# Patient Record
Sex: Female | Born: 2000 | Race: White | Hispanic: No | Marital: Single | Smoking: Current every day smoker
Health system: Southern US, Community
[De-identification: ages and names within clinical notes are randomized; demographics above are authoritative.]

## PROBLEM LIST (undated history)

## (undated) DIAGNOSIS — F32A Depression, unspecified: Secondary | ICD-10-CM

## (undated) DIAGNOSIS — F329 Major depressive disorder, single episode, unspecified: Secondary | ICD-10-CM

## (undated) DIAGNOSIS — F419 Anxiety disorder, unspecified: Secondary | ICD-10-CM

## (undated) HISTORY — DX: Anxiety disorder, unspecified: F41.9

## (undated) HISTORY — DX: Major depressive disorder, single episode, unspecified: F32.9

## (undated) HISTORY — DX: Depression, unspecified: F32.A

---
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
## 2019-02-04 ENCOUNTER — Other Ambulatory Visit: Payer: Self-pay

## 2019-02-04 DIAGNOSIS — Z20822 Contact with and (suspected) exposure to covid-19: Secondary | ICD-10-CM

## 2019-02-07 LAB — NOVEL CORONAVIRUS, NAA: SARS-CoV-2, NAA: NOT DETECTED

## 2019-04-20 ENCOUNTER — Ambulatory Visit: Payer: 59 | Admitting: Family Medicine

## 2019-07-03 ENCOUNTER — Ambulatory Visit: Payer: 59 | Admitting: Family Medicine

## 2019-07-03 ENCOUNTER — Other Ambulatory Visit: Payer: Self-pay

## 2019-07-03 ENCOUNTER — Encounter: Payer: Self-pay | Admitting: Family Medicine

## 2019-07-03 ENCOUNTER — Ambulatory Visit (INDEPENDENT_AMBULATORY_CARE_PROVIDER_SITE_OTHER)
Admission: RE | Admit: 2019-07-03 | Discharge: 2019-07-03 | Disposition: A | Discharge status: Home / Self Care | Payer: 59 | Source: Ambulatory Visit | Attending: Family Medicine | Admitting: Family Medicine

## 2019-07-03 VITALS — BP 100/64 | HR 95 | Temp 99.1°F | Ht 65.0 in | Wt 148.0 lb

## 2019-07-03 DIAGNOSIS — F32A Depression, unspecified: Secondary | ICD-10-CM

## 2019-07-03 DIAGNOSIS — F419 Anxiety disorder, unspecified: Secondary | ICD-10-CM | POA: Diagnosis not present

## 2019-07-03 DIAGNOSIS — M25531 Pain in right wrist: Secondary | ICD-10-CM | POA: Diagnosis not present

## 2019-07-03 DIAGNOSIS — G8929 Other chronic pain: Secondary | ICD-10-CM | POA: Diagnosis not present

## 2019-07-03 DIAGNOSIS — F329 Major depressive disorder, single episode, unspecified: Secondary | ICD-10-CM | POA: Diagnosis not present

## 2019-07-03 MED ORDER — SERTRALINE HCL 50 MG PO TABS: 50.0000 mg | ORAL_TABLET | Freq: Every day | ORAL | 1 refills | Status: DC

## 2019-07-03 NOTE — Progress Notes (Signed)
Subjective:    Patient ID: Megan Eaton, female    DOB: 11-11-2000, 19 y.o.   MRN: [id number]  HPI Chief Complaint  Patient presents with  . Pain    c/o pain right wrist pain and sometimes pain shooting pains in right hand that come and go symptoms x 2 years pain have come back x 1 month, Ibuprofen not helping.    This is a 19 yo female who presents today with above cc.   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 Was seen 5/20 for a virtual visit for anxiety and depression.  Did not start escitalopram.  Did not follow-up as advised continues to have daily anxiety. Sleeping 6-7 hours a night which is improved.  Feels anxious daily.  This has been going on for many years.  In the past she was able to take hydroxyzine but more recently it makes her too sedated.  She lives with her mother.  She has been treated with multiple medications in the past including lithium and Seroquel.  She does not feel that she is currently having any symptoms of mania.  She is interested in resuming a daily medication.  She denies suicidal ideation  Right wrist pain- no known injury. Pain with flexion of hand. Worse with work, pain at back of hand/ wrist. Has a brace which helps some. Worse with overuse. Worse with ice. Has tried heat. Occasional swelling. Has tired ibuprofen 400-600 mg with little relief.    Review of Systems Per HPI    Objective:   Physical Exam Vitals reviewed.  Constitutional:      General: She is not in acute distress.    Appearance: Normal appearance. She is normal weight. She is not ill-appearing, toxic-appearing or diaphoretic.  HENT:     Head: Normocephalic and atraumatic.  Eyes:     Conjunctiva/sclera: Conjunctivae normal.  Cardiovascular:     Rate and Rhythm: Normal rate.  Pulmonary:     Effort: Pulmonary effort is normal.  Musculoskeletal:     Right wrist: Tenderness (lateral) present. No swelling, deformity, effusion or crepitus.  Decreased range of motion (flexion). Normal pulse.  Skin:    General: Skin is warm and dry.  Neurological:     Mental Status: She is alert and oriented to person, place, and time.  Psychiatric:        Mood and Affect: Mood normal.        Behavior: Behavior normal.        Thought Content: Thought content normal.        Judgment: Judgment normal.       BP 100/64   Pulse 95   Temp 99.1 F (37.3 C) (Tympanic)   Ht 5\' 5"  (1.651 m)   Wt 148 lb (67.1 kg)   SpO2 95%   BMI 24.63 kg/m  Wt Readings from Last 3 Encounters:  07/03/19 148 lb (67.1 kg) (78 %, Z= 0.79)*  08/20/18 150 lb (68 kg) (82 %, Z= 0.93)*  01/31/18 141 lb 12.8 oz (64.3 kg) (76 %, Z= 0.72)*   * Growth percentiles are based on CDC (Girls, 2-20 Years) data.       Depression screen [facility] 2/9 07/03/2019 01/31/2018  Decreased Interest 0 2  Down, Depressed, Hopeless 1 1  PHQ - 2 Score 1 3  Altered sleeping - 3  Tired, decreased energy - 3  Change in appetite - 3  Feeling bad or failure about yourself  - 0  Trouble  concentrating - 0  Moving slowly or fidgety/restless - 1  Suicidal thoughts - 0  PHQ-9 Score - 13   . Assessment & Plan:  1. Chronic pain of right wrist -We will check x-ray today.  Discussed additional recommendations.  If x-ray normal, can continue bracing, over-the-counter analgesics, ice/heat.  Follow-up with sports medicine for persistent pain.  If abnormal x-ray, will refer her to orthopedics. - DG Wrist Complete Right; Future  2. Anxiety and depression -Discussed medication and potential side effects including increased depression and suicidal ideation. -She was instructed to stop medication and follow-up immediately if any worsening of symptoms -Provided verbal and written information regarding expectations of treatment and nonpharmacologic interventions for anxiety and depression.  Encouraged her to reconsider therapy. -Follow-up in 8 weeks, can be virtual visit for symptoms - sertraline (ZOLOFT)  50 MG tablet; Take 1 tablet (50 mg total) by mouth daily.  Dispense: 90 tablet; Refill: 1  This visit occurred during the SARS-CoV-2 public health emergency.  Safety protocols were in place, including screening questions prior to the visit, additional usage of staff PPE, and extensive cleaning of exam room while observing appropriate contact time as indicated for disinfecting solutions.    [name], FNP-BC  [facility] at [facility], [facility]  07/05/2019 8:38 AM

## 2019-07-03 NOTE — Patient Instructions (Signed)
I have sent in sertraline (Zoloft), take 1/2 tablet every evening at bedtime for 5 days, then increase to 1 tablet  Follow up- can be virtual, in 8 weeks  Medication for depression and anxiety often takes 6-8 weeks to have a noticeable difference so stick with it. Also the best way for recovery is taking medication and seeing a therapist -- this is so important.      How to help anxiety and depression   1) Regular Exercise - walking, jogging, cycling, dancing, strength training - aiming for 150 minutes of exercise a week -- Yoga has been shown in research to reduce depression and anxiety -- with even just one hour long session per week -- Walk leisurely for 30 minutes every day  2)  Begin a Mindfulness/Meditation practice -- this can take a little as 3 minutes and is helpful for all kinds of mood issues -- You can find resources in books -- Or you can download apps like  -- Headspace App  -- Calm  -- Insignt Timer -- Stop, Breathe & Think   # With each of these Apps - you should decline the "start free trial" offer and as you search through the App should be able to access some of their free content. You can also chose to pay for the content if you find one that works well for you.    # Many of them also offer sleep specific content which may help with insomnia   3) Healthy Diet - Avoid fast foods and processed foods, eat mostly lean proteins, vegetables, fruits and whole grains -- Avoid or decrease Caffeine -- Avoid or decrease Alcohol -- Drink plenty of water, have a balanced diet -- Avoid cigarettes and marijuana (as well as other recreational drugs)   4) Consider contacting a professional therapist  [facility] [phone]

## 2019-07-06 ENCOUNTER — Encounter: Payer: Self-pay | Admitting: Family Medicine

## 2019-07-31 ENCOUNTER — Other Ambulatory Visit: Payer: Self-pay | Admitting: Family Medicine

## 2019-07-31 DIAGNOSIS — F32A Depression, unspecified: Secondary | ICD-10-CM

## 2019-07-31 DIAGNOSIS — F419 Anxiety disorder, unspecified: Secondary | ICD-10-CM

## 2019-07-31 NOTE — Telephone Encounter (Signed)
Called patient on clarity of medication to see if she needs a refill, no answer unable to leave a message mailbox full.

## 2019-07-31 NOTE — Telephone Encounter (Signed)
Patient returned call  Advised of message and patient stated she did need refill on medication She stated she is almost out.

## 2019-08-28 ENCOUNTER — Telehealth: Payer: 59 | Admitting: Family Medicine

## 2019-09-07 ENCOUNTER — Telehealth (INDEPENDENT_AMBULATORY_CARE_PROVIDER_SITE_OTHER): Payer: 59 | Admitting: Family Medicine

## 2019-09-07 ENCOUNTER — Encounter: Payer: Self-pay | Admitting: Family Medicine

## 2019-09-07 ENCOUNTER — Telehealth: Payer: Self-pay | Admitting: Obstetrics and Gynecology

## 2019-09-07 VITALS — Ht 65.0 in | Wt 145.0 lb

## 2019-09-07 DIAGNOSIS — M25531 Pain in right wrist: Secondary | ICD-10-CM

## 2019-09-07 DIAGNOSIS — F329 Major depressive disorder, single episode, unspecified: Secondary | ICD-10-CM

## 2019-09-07 DIAGNOSIS — J3081 Allergic rhinitis due to animal (cat) (dog) hair and dander: Secondary | ICD-10-CM

## 2019-09-07 DIAGNOSIS — F32A Depression, unspecified: Secondary | ICD-10-CM

## 2019-09-07 DIAGNOSIS — F419 Anxiety disorder, unspecified: Secondary | ICD-10-CM | POA: Diagnosis not present

## 2019-09-07 DIAGNOSIS — G8929 Other chronic pain: Secondary | ICD-10-CM

## 2019-09-07 MED ORDER — SERTRALINE HCL 50 MG PO TABS: 75.0000 mg | ORAL_TABLET | Freq: Every day | ORAL | 1 refills | Status: DC

## 2019-09-07 MED ORDER — FLUTICASONE PROPIONATE 50 MCG/ACT NA SUSP: 2.0000 | Freq: Every day | NASAL | 6 refills | Status: AC

## 2019-09-07 NOTE — Progress Notes (Signed)
Virtual Visit via Video Note  I connected with Megan Eaton on 09/07/19 at 11:30 AM EDT by a video enabled telemedicine application and verified that I am speaking with the correct person using two identifiers.  Location: Patient: In her home Provider: [facility] Persons participating in virtual visit: Patient and provider   I discussed the limitations of evaluation and management by telemedicine and the availability of in person appointments. The patient expressed understanding and agreed to proceed.  History of Present Illness: This is a 19 year old female who presents today for virtual visit to follow-up on depression/anxiety and right wrist pain.  She last had an office visit 07/03/2019-at that time we reviewed findings from PHQ-9 and discussed medications that she had previously been on.  She was agreeable to starting sertraline and looking into therapy.  She has been taking sertraline 50 mg and has done well.  She is currently having weekly therapy sessions which she also feels are helpful.  Current GAD-7 score 2, PHQ-9 score 10.  GAD score significantly improved, PHQ-9 score increased.  She reports that her anxiety is significantly improved.  She is taking occasional hydroxyzine for breakthrough panic attacks.  She is having good relief with this.  She reports that she feels her depression is just "the way it is going to be."  Energy level has been good.  She has been cleaning her room and doing some painting.  She had a family vacation in [place] which was enjoyable.  She continues to work at [employer].  Allergy symptoms improved with hydroxyzine as needed.  Her mother is going to be getting a cat soon and she is concerned about allergies as she has known cat allergy.  Right wrist pain.  Was better now is worse.  She is interested in seeing orthopedics but she wants to check with her insurance to see who is covered prior to me placing referral.    Observations/Objective:  Ht 5\' 5"  (1.651 m)   Wt 145 lb (65.8 kg)   BMI 24.13 kg/m  Wt Readings from Last 3 Encounters:  09/07/19 145 lb (65.8 kg) (75 %, Z= 0.67)*  07/03/19 148 lb (67.1 kg) (78 %, Z= 0.79)*  08/20/18 150 lb (68 kg) (82 %, Z= 0.93)*   * Growth percentiles are based on CDC (Girls, 2-20 Years) data.   Depression screen [facility] 2/9 09/07/2019 07/03/2019 01/31/2018  Decreased Interest 1 0 2  Down, Depressed, Hopeless 1 1 1   PHQ - 2 Score 2 1 3   Altered sleeping 3 - 3  Tired, decreased energy 1 - 3  Change in appetite 3 - 3  Feeling bad or failure about yourself  0 - 0  Trouble concentrating 1 - 0  Moving slowly or fidgety/restless 0 - 1  Suicidal thoughts 0 - 0  PHQ-9 Score 10 - 13  Difficult doing work/chores Not difficult at all - -   GAD 7 : Generalized Anxiety Score 09/07/2019 08/20/2018  Nervous, Anxious, on Edge 1 3  Control/stop worrying 0 3  Worry too much - different things 0 3  Trouble relaxing 1 1  Restless 0 3  Easily annoyed or irritable 0 2  Afraid - awful might happen 0 3  Total GAD 7 Score 2 18  Anxiety Difficulty Not difficult at all Very difficult     Assessment and Plan: 1. Anxiety and depression -Anxiety improved, depression not.  Will increase her sertraline from 50 mg to 75 mg daily.  Continue weekly therapy. -  Follow-up in 4 months, sooner if any worsening symptoms - sertraline (ZOLOFT) 50 MG tablet; Take 1.5 tablets (75 mg total) by mouth daily.  Dispense: 135 tablet; Refill: 1  2. Chronic pain of right wrist -Negative x-ray previously.  She will let me know which orthopedic specialist she would like to see.  3. Allergic rhinitis due to animal hair and dander -Discussed options regarding over-the-counter long-acting antihistamines.  Can try Xyzol and add fluticasone as needed - fluticasone (FLONASE) 50 MCG/ACT nasal spray; Place 2 sprays into both nostrils daily.  Dispense: 16 g; Refill: [place], FNP-BC  [facility] [place]  09/07/2019 11:54 AM   Follow Up Instructions:    I discussed the assessment and treatment plan with the patient. The patient was provided an opportunity to ask questions and all were answered. The patient agreed with the plan and demonstrated an understanding of the instructions.   The patient was advised to call back or seek an in-person evaluation if the symptoms worsen or if the condition fails to improve as anticipated.   Elby Beck, FNP

## 2019-09-07 NOTE — Telephone Encounter (Signed)
Patient is schreduled to est care nexplanon replacement with ABC on 10/12/19 at 1:30

## 2019-09-08 NOTE — Telephone Encounter (Signed)
Noted. Will order to arrive by apt date/time. 

## 2019-10-12 ENCOUNTER — Ambulatory Visit (INDEPENDENT_AMBULATORY_CARE_PROVIDER_SITE_OTHER): Payer: 59 | Admitting: Obstetrics and Gynecology

## 2019-10-12 ENCOUNTER — Encounter: Payer: Self-pay | Admitting: Obstetrics and Gynecology

## 2019-10-12 ENCOUNTER — Other Ambulatory Visit (HOSPITAL_COMMUNITY)
Admission: RE | Admit: 2019-10-12 | Discharge: 2019-10-12 | Disposition: A | Discharge status: Home / Self Care | Payer: 59 | Source: Ambulatory Visit | Attending: Obstetrics and Gynecology | Admitting: Obstetrics and Gynecology

## 2019-10-12 ENCOUNTER — Other Ambulatory Visit: Payer: Self-pay

## 2019-10-12 VITALS — BP 100/60 | Ht 65.0 in | Wt 146.0 lb

## 2019-10-12 DIAGNOSIS — Z30014 Encounter for initial prescription of intrauterine contraceptive device: Secondary | ICD-10-CM | POA: Diagnosis not present

## 2019-10-12 DIAGNOSIS — Z113 Encounter for screening for infections with a predominantly sexual mode of transmission: Secondary | ICD-10-CM

## 2019-10-12 DIAGNOSIS — Z3046 Encounter for surveillance of implantable subdermal contraceptive: Secondary | ICD-10-CM | POA: Diagnosis not present

## 2019-10-12 DIAGNOSIS — Z01419 Encounter for gynecological examination (general) (routine) without abnormal findings: Secondary | ICD-10-CM | POA: Diagnosis not present

## 2019-10-12 DIAGNOSIS — A749 Chlamydial infection, unspecified: Secondary | ICD-10-CM

## 2019-10-12 NOTE — Progress Notes (Signed)
PCP:  Emi Belfast, FNP    Chief Complaint  Patient presents with  . Gynecologic Exam    std testing  . Contraception    Nexplanon removal, does not want replacement, would like something non hormonal     HPI:      Ms. Megan Eaton is a 19 y.o. No obstetric history on file. whose LMP was No LMP recorded. Patient has had an implant., presents today for her NP annual examination.  Her menses are irregular with expired nexplanon. Dysmenorrhea mild. Menses used to be monthly prior to [facility].  Sex activity: single partner, contraception - condoms. Nexplanon placed 04/2016. Would like non-hormonal BC. Did OCPs in past with mood changes and has had acne with nexplanon. No hx of HTN, DVTs. Hx of possible migraines with aura but nothing recent. Also had other vision issues at that time.  Last Pap: N/A due to age Hx of STDs: none  There is a FH of breast cancer in her mat aunt. Pt qualifies for cancer genetic testing age 58. There is no FH of ovarian cancer.   Tobacco use: vapes daily Alcohol use: occas Daily marijuana use.  Exercise: moderately active  She does get adequate calcium but not Vitamin D in her diet. Unsure about Gardasil vaccine series in past    Upstream - 10/12/19 1333      Pregnancy Intention Screening   Does the patient want to become pregnant in the next year? No    Does the patient's partner want to become pregnant in the next year? No    Would the patient like to discuss contraceptive options today? Yes          The pregnancy intention screening data noted above was reviewed. Potential methods of contraception were discussed. The patient elected to proceed with IUD or IUS.    Past Medical History:  Diagnosis Date  . Anxiety   . Depression     Past Surgical History:  Procedure Laterality Date  . EYE SURGERY Right 2005    Family History  Problem Relation Age of Onset  . Arthritis Mother   . Multiple sclerosis Father   . Hypertension Father     . Breast cancer Maternal Aunt        30-40s  . Heart disease Maternal Grandfather   . Heart attack Maternal Grandfather        3xs    Social History   Socioeconomic History  . Marital status: Single    Spouse name: Not on file  . Number of children: Not on file  . Years of education: Not on file  . Highest education level: Not on file  Occupational History  . Not on file  Tobacco Use  . Smoking status: Former Games developer  . Smokeless tobacco: Never Used  Vaping Use  . Vaping Use: Every day  . Start date: 01/31/2014  . Devices: 50 mg  Substance and Sexual Activity  . Alcohol use: Yes    Comment: occ  . Drug use: Yes    Types: Marijuana  . Sexual activity: Yes    Partners: Male    Birth control/protection: Implant  Other Topics Concern  . Not on file  Social History Narrative  . Not on file   Social Determinants of Health   Financial Resource Strain:   . Difficulty of Paying Living Expenses:   Food Insecurity:   . Worried About Programme researcher, broadcasting/film/video in the Last Year:   . [employer]  of Food in the Last Year:   Transportation Needs:   . Freight forwarder (Medical):   Marland Kitchen Lack of Transportation (Non-Medical):   Physical Activity:   . Days of Exercise per Week:   . Minutes of Exercise per Session:   Stress:   . Feeling of Stress :   Social Connections:   . Frequency of Communication with Friends and Family:   . Frequency of Social Gatherings with Friends and Family:   . Attends Religious Services:   . Active Member of Clubs or Organizations:   . Attends Banker Meetings:   Marland Kitchen Marital Status:   Intimate Partner Violence:   . Fear of Current or Ex-Partner:   . Emotionally Abused:   Marland Kitchen Physically Abused:   . Sexually Abused:      Current Outpatient Medications:  .  fluticasone (FLONASE) 50 MCG/ACT nasal spray, Place 2 sprays into both nostrils daily., Disp: 16 g, Rfl: 6 .  hydrOXYzine (ATARAX/VISTARIL) 25 MG tablet, TAKE 1-2 TABLETS (25-50 MG TOTAL) BY  MOUTH EVERY 8 (EIGHT) HOURS AS NEEDED FOR ANXIETY., Disp: 30 tablet, Rfl: 1 .  sertraline (ZOLOFT) 50 MG tablet, Take 1.5 tablets (75 mg total) by mouth daily., Disp: 135 tablet, Rfl: 1     ROS:  Review of Systems  Constitutional: Positive for fatigue. Negative for fever and unexpected weight change.  Respiratory: Negative for cough, shortness of breath and wheezing.   Cardiovascular: Negative for chest pain, palpitations and leg swelling.  Gastrointestinal: Positive for diarrhea and nausea. Negative for blood in stool, constipation and vomiting.  Endocrine: Negative for cold intolerance, heat intolerance and polyuria.  Genitourinary: Negative for dyspareunia, dysuria, flank pain, frequency, genital sores, hematuria, menstrual problem, pelvic pain, urgency, vaginal bleeding, vaginal discharge and vaginal pain.  Musculoskeletal: Positive for arthralgias. Negative for back pain, joint swelling and myalgias.  Skin: Negative for rash.  Neurological: Negative for dizziness, syncope, light-headedness, numbness and headaches.  Hematological: Negative for adenopathy.  Psychiatric/Behavioral: Positive for agitation and dysphoric mood. Negative for confusion, sleep disturbance and suicidal ideas. The patient is not nervous/anxious.   BREAST: No symptoms   Objective: BP 100/60   Ht 5\' 5"  (1.651 m)   Wt 146 lb (66.2 kg)   BMI 24.30 kg/m    Physical Exam Constitutional:      Appearance: She is well-developed.  Genitourinary:     Vulva, vagina, cervix, uterus, right adnexa and left adnexa normal.     No vulval lesion or tenderness noted.     No vaginal discharge, erythema or tenderness.     No cervical polyp.     Uterus is not enlarged or tender.     No right or left adnexal mass present.     Right adnexa not tender.     Left adnexa not tender.  Neck:     Thyroid: No thyromegaly.  Cardiovascular:     Rate and Rhythm: Normal rate and regular rhythm.     Heart sounds: Normal heart  sounds. No murmur heard.   Pulmonary:     Effort: Pulmonary effort is normal.     Breath sounds: Normal breath sounds.  Chest:     Breasts:        Right: No mass, nipple discharge, skin change or tenderness.        Left: No mass, nipple discharge, skin change or tenderness.  Abdominal:     Palpations: Abdomen is soft.     Tenderness: There is no abdominal tenderness. There  is no guarding.  Musculoskeletal:        General: Normal range of motion.     Cervical back: Normal range of motion.  Neurological:     General: No focal deficit present.     Mental Status: She is alert and oriented to person, place, and time.     Cranial Nerves: No cranial nerve deficit.  Skin:    General: Skin is warm and dry.  Psychiatric:        Mood and Affect: Mood normal.        Behavior: Behavior normal.        Thought Content: Thought content normal.        Judgment: Judgment normal.  Vitals reviewed.   Nexplanon removal Procedure note - The Nexplanon was noted in the patient's arm and the end was identified. The skin was cleansed with a Betadine solution. A small injection of subcutaneous lidocaine with epinephrine was given over the end of the implant. An incision was made at the end of the implant. The rod was noted in the incision and grasped with a hemostat. It was noted to be intact.  Steri-Strip was placed approximating the incision. Hemostasis was noted.  Assessment/Plan: Encounter for annual routine gynecological examination  Screening for STD (sexually transmitted disease) - Plan: Cervicovaginal ancillary only, HIV Antibody (routine testing w rflx), RPR, HSV 2 antibody, IgG, Hepatitis C antibody  Encounter for initial prescription of intrauterine contraceptive device (IUD)--Paragard pros/cons/risks/beneftis discussed. Pt would like to be off BC for a few wks and RTO with menses for insertion. Will call for appt. Will Rx cytotec, NSAIDs 1 hr before appt. Condoms in meantime.   Nexplanon  removal--She was told to remove the dressing in 12-24 hours, to keep the incision area dry for 24 hours and to remove the Steristrip in 2-3  days.  Notify us if any signs of tenderness, redness, pain, or fevers develop.    GYN counsel STD prevention, family planning choices, adequate intake of calcium and vitamin D, diet and exercise     F/U  Return in about 1 year (around 10/11/2020).  Saren Corkern B. Francene Mcerlean, PA-C 10/12/2019 5:14 PM

## 2019-10-12 NOTE — Patient Instructions (Signed)
I value your feedback and entrusting us with your care. If you get a [facility] patient survey, I would appreciate you taking the time to let us know about your experience today. Thank you!  As of March 05, 2019, your lab results will be released to your MyChart immediately, before I even have a chance to see them. Please give me time to review them and contact you if there are any abnormalities. Thank you for your patience.   Remove the dressing in 24 hours,  keep the incision area dry for 24 hours and remove the Steristrip in 2-3  days.  Notify us if any signs of tenderness, redness, pain, or fevers develop.  

## 2019-10-13 LAB — RPR: RPR Ser Ql: NONREACTIVE

## 2019-10-13 LAB — HSV 2 ANTIBODY, IGG: HSV 2 IgG, Type Spec: <0.91 index (ref 0.00–0.90)

## 2019-10-13 LAB — HEPATITIS C ANTIBODY: Hep C Virus Ab: <0.1 s/co ratio (ref 0.0–0.9)

## 2019-10-13 LAB — HIV ANTIBODY (ROUTINE TESTING W REFLEX): HIV Screen 4th Generation wRfx: NONREACTIVE

## 2019-10-14 DIAGNOSIS — A749 Chlamydial infection, unspecified: Secondary | ICD-10-CM | POA: Insufficient documentation

## 2019-10-14 LAB — CERVICOVAGINAL ANCILLARY ONLY
Chlamydia: POSITIVE — AB
Comment: NEGATIVE
Comment: NORMAL
Neisseria Gonorrhea: NEGATIVE

## 2019-10-14 MED ORDER — AZITHROMYCIN 500 MG PO TABS: 1000.0000 mg | ORAL_TABLET | Freq: Once | ORAL | 0 refills | Status: AC

## 2019-10-14 NOTE — Addendum Note (Signed)
Addended by: Althea Grimmer B on: 10/14/2019 01:56 PM   Modules accepted: Orders

## 2019-10-15 NOTE — Progress Notes (Signed)
[facility] notified. 

## 2019-11-07 ENCOUNTER — Other Ambulatory Visit: Payer: Self-pay | Admitting: Family Medicine

## 2019-11-07 DIAGNOSIS — F329 Major depressive disorder, single episode, unspecified: Secondary | ICD-10-CM

## 2019-11-07 DIAGNOSIS — F32A Depression, unspecified: Secondary | ICD-10-CM

## 2019-11-09 NOTE — Telephone Encounter (Signed)
Last prescribed on 07/31/2019 Last OV (follow up med management) with Deboraha Sprang on 09/07/2019 No future OV scheduled

## 2020-01-08 ENCOUNTER — Encounter: Payer: Self-pay | Admitting: Family Medicine

## 2020-01-08 ENCOUNTER — Ambulatory Visit: Payer: 59 | Admitting: Family Medicine

## 2020-01-08 ENCOUNTER — Other Ambulatory Visit: Payer: Self-pay

## 2020-01-08 ENCOUNTER — Telehealth: Payer: Self-pay | Admitting: Family Medicine

## 2020-01-08 VITALS — BP 98/62 | HR 78 | Temp 97.9°F | Ht 65.0 in | Wt 134.0 lb

## 2020-01-08 DIAGNOSIS — G43109 Migraine with aura, not intractable, without status migrainosus: Secondary | ICD-10-CM | POA: Diagnosis not present

## 2020-01-08 DIAGNOSIS — R110 Nausea: Secondary | ICD-10-CM | POA: Diagnosis not present

## 2020-01-08 DIAGNOSIS — F419 Anxiety disorder, unspecified: Secondary | ICD-10-CM | POA: Diagnosis not present

## 2020-01-08 DIAGNOSIS — F32A Depression, unspecified: Secondary | ICD-10-CM

## 2020-01-08 DIAGNOSIS — Z23 Encounter for immunization: Secondary | ICD-10-CM

## 2020-01-08 MED ORDER — SERTRALINE HCL 100 MG PO TABS: 100.0000 mg | ORAL_TABLET | Freq: Every day | ORAL | 1 refills | Status: DC

## 2020-01-08 NOTE — Telephone Encounter (Signed)
Patient seen today and stated she forgot to ask for a work note from missing work on Wednesday. Patient is wondering if it can be placed via mychart. Please advise.

## 2020-01-08 NOTE — Progress Notes (Signed)
Subjective:    Patient ID: Megan Eaton, female    DOB: 12-31-2000, 19 y.o.   MRN: [id number]  HPI  Chief Complaint  Patient presents with   Migraine   This is a 19 yo female who presents today with above cc.   She was recently fired from her job, attendance was an issue. Always late. Struggles with time management. Has been discussing with her therapist. Was unemployed for awhile, now working at [employer]. Is late everyday.  Difficulty remembering things and staying on track. Would like to go up on sertraline. Currently on 75 mg. Sleep is erratic.   Headaches- started 4 days ago, thought it was a tension headache, went to bed with it, slept well, headache came on later in the day, some nausea, light sensitivity, saw some spots prior to it starting. Lasted all day, went away after sleeping. Took ibuprofen 600 mg with little relief. Drinks a lot of caffeine.  Poor water intake.   Decreased appetite, worse with stress and anxiety. Nausea with stress. Always with terrible eating habits. Generally eats one meal a day, some protein shakes, smoothies during day.    Review of Systems Per HPI    Objective:   Physical Exam Physical Exam  Vitals reviewed. Constitutional: Oriented to person, place, and time. Appears well-developed and well-nourished.  HENT:  Head: Normocephalic and atraumatic.  Eyes: Conjunctivae are normal.  Neck: Normal range of motion. Neck supple.  Cardiovascular: Normal rate.   Pulmonary/Chest: Effort normal.  Musculoskeletal: Normal range of motion.  Neurological: Alert and oriented to person, place, and time.  Psychiatric: Normal mood and affect. Behavior is normal. Judgment and thought content normal.      BP 98/62    Pulse 78    Temp 97.9 F (36.6 C) (Temporal)    Ht 5\' 5"  (1.651 m)    Wt 134 lb (60.8 kg)    LMP 12/29/2019 (Approximate)    SpO2 99%    BMI 22.30 kg/m  Wt Readings from Last 3 Encounters:  01/08/20 134 lb (60.8 kg)  10/12/19 146 lb (66.2  kg) (76 %, Z= 0.70)*  09/07/19 145 lb (65.8 kg) (75 %, Z= 0.67)*   * Growth percentiles are based on CDC (Girls, 2-20 Years) data.   GAD 7 : Generalized Anxiety Score 01/08/2020 09/07/2019 08/20/2018  Nervous, Anxious, on Edge 1 1 3   Control/stop worrying 2 0 3  Worry too much - different things 2 0 3  Trouble relaxing 2 1 1   Restless 3 0 3  Easily annoyed or irritable 3 0 2  Afraid - awful might happen 2 0 3  Total GAD 7 Score 15 2 18   Anxiety Difficulty Very difficult Not difficult at all Very difficult    PHQ9 SCORE ONLY 01/08/2020 09/07/2019 07/03/2019  PHQ-9 Total Score 17 10 1        Assessment & Plan:  1. Anxiety and depression - encouraged her to continue therapy, discussed brain development, concentration, attention, resources and strategies.  - will increase sertraline dose, follow up in 3 months, sooner if worsening symptoms - sertraline (ZOLOFT) 100 MG tablet; Take 1 tablet (100 mg total) by mouth daily.  Dispense: 90 tablet; Refill: 1  2. Nausea without vomiting -Encouraged her to eat regular meals, start over-the-counter famotidine twice daily, follow-up if no improvement  3. Need for influenza vaccination - Flu Vaccine QUAD 6+ mos PF IM (Fluarix Quad PF)  4.  Migraine headache -Discussed common triggers, encouraged her to keep a headache  log, increase hydration, regular sleep - Provided written and verbal information regarding diagnosis and treatment.  This visit occurred during the SARS-CoV-2 public health emergency.  Safety protocols were in place, including screening questions prior to the visit, additional usage of staff PPE, and extensive cleaning of exam room while observing appropriate contact time as indicated for disinfecting solutions.     [name], FNP-BC  [facility], [facility]  01/08/2020 5:58 PM

## 2020-01-08 NOTE — Patient Instructions (Addendum)
When headaches occur, take 2 regular ibuprofen, 1 Tylenol and drink several glasses of water Keep a headache log- frequency, symptoms, triggers (sleep, hydration, stress)  Try taking Pepcid AC (store brand is fine) every night for at least 2 weeks.   Follow up in 3 months   Migraine Headache A migraine headache is a very strong throbbing pain on one side or both sides of your head. This type of headache can also cause other symptoms. It can last from 4 hours to 3 days. Talk with your doctor about what things may bring on (trigger) this condition. What are the causes? The exact cause of this condition is not known. This condition may be triggered or caused by:  Drinking alcohol.  Smoking.  Taking medicines, such as: ? Medicine used to treat chest pain (nitroglycerin). ? Birth control pills. ? Estrogen. ? Some blood pressure medicines.  Eating or drinking certain products.  Doing physical activity. Other things that may trigger a migraine headache include:  Having a menstrual period.  Pregnancy.  Hunger.  Stress.  Not getting enough sleep or getting too much sleep.  Weather changes.  Tiredness (fatigue). What increases the risk?  Being 69-15 years old.  Being female.  Having a family history of migraine headaches.  Being Caucasian.  Having depression or anxiety.  Being very overweight. What are the signs or symptoms?  A throbbing pain. This pain may: ? Happen in any area of the head, such as on one side or both sides. ? Make it hard to do daily activities. ? Get worse with physical activity. ? Get worse around bright lights or loud noises.  Other symptoms may include: ? Feeling sick to your stomach (nauseous). ? Vomiting. ? Dizziness. ? Being sensitive to bright lights, loud noises, or smells.  Before you get a migraine headache, you may get warning signs (an aura). An aura may include: ? Seeing flashing lights or having blind spots. ? Seeing bright  spots, halos, or zigzag lines. ? Having tunnel vision or blurred vision. ? Having numbness or a tingling feeling. ? Having trouble talking. ? Having weak muscles.  Some people have symptoms after a migraine headache (postdromal phase), such as: ? Tiredness. ? Trouble thinking (concentrating). How is this treated?  Taking medicines that: ? Relieve pain. ? Relieve the feeling of being sick to your stomach. ? Prevent migraine headaches.  Treatment may also include: ? Having acupuncture. ? Avoiding foods that bring on migraine headaches. ? Learning ways to control your body functions (biofeedback). ? Therapy to help you know and deal with negative thoughts (cognitive behavioral therapy). Follow these instructions at home: Medicines  Take over-the-counter and prescription medicines only as told by your doctor.  Ask your doctor if the medicine prescribed to you: ? Requires you to avoid driving or using heavy machinery. ? Can cause trouble pooping (constipation). You may need to take these steps to prevent or treat trouble pooping:  Drink enough fluid to keep your pee (urine) pale yellow.  Take over-the-counter or prescription medicines.  Eat foods that are high in fiber. These include beans, whole grains, and fresh fruits and vegetables.  Limit foods that are high in fat and sugar. These include fried or sweet foods. Lifestyle  Do not drink alcohol.  Do not use any products that contain nicotine or tobacco, such as cigarettes, e-cigarettes, and chewing tobacco. If you need help quitting, ask your doctor.  Get at least 8 hours of sleep every night.  Limit and  deal with stress. General instructions      Keep a journal to find out what may bring on your migraine headaches. For example, write down: ? What you eat and drink. ? How much sleep you get. ? Any change in what you eat or drink. ? Any change in your medicines.  If you have a migraine headache: ? Avoid things  that make your symptoms worse, such as bright lights. ? It may help to lie down in a dark, quiet room. ? Do not drive or use heavy machinery. ? Ask your doctor what activities are safe for you.  Keep all follow-up visits as told by your doctor. This is important. Contact a doctor if:  You get a migraine headache that is different or worse than others you have had.  You have more than 15 headache days in one month. Get help right away if:  Your migraine headache gets very bad.  Your migraine headache lasts longer than 72 hours.  You have a fever.  You have a stiff neck.  You have trouble seeing.  Your muscles feel weak or like you cannot control them.  You start to lose your balance a lot.  You start to have trouble walking.  You pass out (faint).  You have a seizure. Summary  A migraine headache is a very strong throbbing pain on one side or both sides of your head. These headaches can also cause other symptoms.  This condition may be treated with medicines and changes to your lifestyle.  Keep a journal to find out what may bring on your migraine headaches.  Contact a doctor if you get a migraine headache that is different or worse than others you have had.  Contact your doctor if you have more than 15 headache days in a month. This information is not intended to replace advice given to you by your health care provider. Make sure you discuss any questions you have with your health care provider. Document Revised: 07/04/2018 Document Reviewed: 04/24/2018 Elsevier Patient Education  [place].

## 2020-01-10 ENCOUNTER — Other Ambulatory Visit: Payer: Self-pay | Admitting: Family Medicine

## 2020-01-10 ENCOUNTER — Encounter: Payer: Self-pay | Admitting: Family Medicine

## 2020-01-10 DIAGNOSIS — F419 Anxiety disorder, unspecified: Secondary | ICD-10-CM

## 2020-01-10 DIAGNOSIS — F32A Depression, unspecified: Secondary | ICD-10-CM

## 2020-01-10 DIAGNOSIS — R41840 Attention and concentration deficit: Secondary | ICD-10-CM

## 2020-01-10 NOTE — Telephone Encounter (Signed)
Excuse letter written. Sent patient a Wellsite geologist.

## 2020-01-12 ENCOUNTER — Other Ambulatory Visit: Payer: Self-pay

## 2020-01-12 DIAGNOSIS — F32A Depression, unspecified: Secondary | ICD-10-CM

## 2020-01-12 DIAGNOSIS — F419 Anxiety disorder, unspecified: Secondary | ICD-10-CM

## 2020-01-12 MED ORDER — SERTRALINE HCL 100 MG PO TABS: 100.0000 mg | ORAL_TABLET | Freq: Every day | ORAL | 1 refills | Status: DC

## 2020-01-14 ENCOUNTER — Other Ambulatory Visit: Payer: Self-pay | Admitting: Family Medicine

## 2020-01-14 DIAGNOSIS — M25531 Pain in right wrist: Secondary | ICD-10-CM

## 2020-01-14 DIAGNOSIS — G8929 Other chronic pain: Secondary | ICD-10-CM

## 2020-01-25 ENCOUNTER — Encounter: Payer: Self-pay | Admitting: Family Medicine

## 2020-02-08 ENCOUNTER — Encounter: Payer: Self-pay | Admitting: Family Medicine

## 2020-02-08 NOTE — Telephone Encounter (Signed)
Addressed her concern in previous message.

## 2020-02-17 ENCOUNTER — Ambulatory Visit: Payer: Self-pay | Admitting: Family Medicine

## 2020-03-09 ENCOUNTER — Ambulatory Visit: Payer: 59 | Admitting: Family Medicine

## 2020-03-09 ENCOUNTER — Telehealth: Payer: Self-pay

## 2020-03-09 DIAGNOSIS — Z0289 Encounter for other administrative examinations: Secondary | ICD-10-CM

## 2020-03-09 NOTE — Telephone Encounter (Signed)
[facility] Night - Client Nonclinical Telephone Record AccessNurse Client [facility] Night - Client Client Site [facility] - Night Physician Deboraha Sprang- NP Contact Type Call Who Is Calling Patient / Member / Family / Caregiver Caller Name [name] Caller Phone Number [phone] Patient Name Megan Eaton Patient DOB Jun 10, 2000 Call Type Message Only Information Provided Reason for Call Request to Reschedule Office Appointment Initial Comment Caller wants to reschedule appointment Additional Comment Office hours were provided. Disp. Time Disposition Final User 03/09/2020 6:14:00 AM General Information Provided Yes Leotis Shames Call Closed By: Leotis Shames Transaction Date/Time: 03/09/2020 6:12:25 AM (ET)

## 2020-03-09 NOTE — Telephone Encounter (Signed)
Attempted to called patient x2.. patient hung the phone up the first time and second time voice mail came on and mailbox is full. Was calling to rsch appt per patient contacted after hours to rsch

## 2020-03-26 DIAGNOSIS — U071 COVID-19: Secondary | ICD-10-CM

## 2020-03-26 HISTORY — DX: COVID-19: U07.1

## 2020-03-29 ENCOUNTER — Other Ambulatory Visit (INDEPENDENT_AMBULATORY_CARE_PROVIDER_SITE_OTHER): Payer: 59

## 2020-03-29 ENCOUNTER — Telehealth (INDEPENDENT_AMBULATORY_CARE_PROVIDER_SITE_OTHER): Payer: 59 | Admitting: Family Medicine

## 2020-03-29 ENCOUNTER — Other Ambulatory Visit: Payer: Self-pay | Admitting: Family Medicine

## 2020-03-29 ENCOUNTER — Encounter: Payer: Self-pay | Admitting: Family Medicine

## 2020-03-29 VITALS — Temp 98.0°F | Wt 125.0 lb

## 2020-03-29 DIAGNOSIS — R509 Fever, unspecified: Secondary | ICD-10-CM | POA: Insufficient documentation

## 2020-03-29 DIAGNOSIS — R109 Unspecified abdominal pain: Secondary | ICD-10-CM

## 2020-03-29 LAB — POCT URINALYSIS DIPSTICK
Bilirubin, UA: NEGATIVE
Blood, UA: NEGATIVE
Glucose, UA: NEGATIVE
Ketones, UA: NEGATIVE
Leukocytes, UA: NEGATIVE
Nitrite, UA: NEGATIVE
Protein, UA: NEGATIVE
Spec Grav, UA: 1.025 (ref 1.010–1.025)
Urobilinogen, UA: 0.2 E.U./dL
pH, UA: 6 (ref 5.0–8.0)

## 2020-03-29 NOTE — Progress Notes (Signed)
Patient ID: Megan Eaton, female    DOB: [birth year], 20 y.o.   MRN: [id number]  Virtual visit completed through MyChart, a video enabled telemedicine application. Due to national recommendations of social distancing due to COVID-19, a virtual visit is felt to be most appropriate for this patient at this time. Reviewed limitations, risks, security and privacy concerns of performing a virtual visit and the availability of in person appointments. I also reviewed that there may be a patient responsible charge related to this service. The patient agreed to proceed.   Patient location: home Provider location: [facility], office Persons participating in this virtual visit: patient, provider   If any vitals were documented, they were collected by patient at home unless specified below.    Temp 98 F (36.7 C)   Wt 125 lb (56.7 kg)   BMI 20.80 kg/m    CC: UTI? Subjective:   HPI: Megan Eaton is a 20 y.o. female presenting on 03/29/2020 for Bilateral Flank Pain (Started about a month ago. Urine was cloudy a month ago. Has cleared up. Fever the last few days. No visible blood in urine. )   1 mo ago after holding urine at work had UTI symptoms of dysuria incomplete bladder emptying frequency and cloudy urine - managed with increasing cranberry juice and water. Intermittent R flank pain over the past month as well.   A few days ago started noticing fevers/chills Tmax 101.8 on Sunday, nausea which led to vomiting twice 2 days ago (states she vomits easily). Current malaise. Diarrhea started this week - watery x2-3/day.  Managing symptoms with tylenol today, drinking water.   No current dysuria, urgency, frequency, hematuria, flank pain or hematuria.   No significant ST, cough, head or chest congestion, loss of taste or smell, abd pain.   H/o kidney infection 2018 - this feels similar.  + COVID exposure at work.  COVID vaccine - has not received.  Has not been tested for COVID  recently.      Relevant past medical, surgical, family and social history reviewed and updated as indicated. Interim medical history since our last visit reviewed. Allergies and medications reviewed and updated. Outpatient Medications Prior to Visit  Medication Sig Dispense Refill  . fluticasone (FLONASE) 50 MCG/ACT nasal spray Place 2 sprays into both nostrils daily. 16 g 6  . hydrOXYzine (ATARAX/VISTARIL) 25 MG tablet TAKE 1-2 TABLETS (25-50 MG TOTAL) BY MOUTH EVERY 8 (EIGHT) HOURS AS NEEDED FOR ANXIETY. 30 tablet 1  . sertraline (ZOLOFT) 100 MG tablet Take 1 tablet (100 mg total) by mouth daily. 90 tablet 1   No facility-administered medications prior to visit.     Per HPI unless specifically indicated in ROS section below Review of Systems Objective:  Temp 98 F (36.7 C)   Wt 125 lb (56.7 kg)   BMI 20.80 kg/m   Wt Readings from Last 3 Encounters:  03/29/20 125 lb (56.7 kg)  01/08/20 134 lb (60.8 kg)  10/12/19 146 lb (66.2 kg) (76 %, Z= 0.70)*   * Growth percentiles are based on CDC (Girls, 2-20 Years) data.       Physical exam: Gen: alert, NAD, not ill appearing Pulm: speaks in complete sentences without increased work of breathing Psych: normal mood, normal thought content      Assessment & Plan:   Problem List Items Addressed This Visit    Fever - Primary    With current symptoms of nausea/vomiting, malaise, diarrhea. 1 month ago had more  UTI symptoms. Never had respiratory symptoms.  In h/o pyelonephritis, need to rule this out. I have asked her to drop off urine sample this afternoon (reviewed clean catch technique). She will do this this afternoon.  In setting COVID pandemic, will send off COVID swab when she comes this afternoon.  Red flags to seek urgent in-person care reviewed.       Other Visit Diagnoses    Flank pain           No orders of the defined types were placed in this encounter.  No orders of the defined types were placed in this  encounter.   I discussed the assessment and treatment plan with the patient. The patient was provided an opportunity to ask questions and all were answered. The patient agreed with the plan and demonstrated an understanding of the instructions. The patient was advised to call back or seek an in-person evaluation if the symptoms worsen or if the condition fails to improve as anticipated.  Follow up plan: Return if symptoms worsen or fail to improve.  Eustaquio Boyden, MD

## 2020-03-29 NOTE — Addendum Note (Signed)
Addended by: Karenann Cai on: 03/29/2020 04:39 PM   Modules accepted: Orders

## 2020-03-29 NOTE — Assessment & Plan Note (Addendum)
With current symptoms of nausea/vomiting, malaise, diarrhea. 1 month ago had more UTI symptoms. Never had respiratory symptoms.  In h/o pyelonephritis, need to rule this out. I have asked her to drop off urine sample this afternoon (reviewed clean catch technique). She will do this this afternoon.  In setting COVID pandemic, will send off COVID swab when she comes this afternoon.  Red flags to seek urgent in-person care reviewed.

## 2020-03-31 ENCOUNTER — Encounter: Payer: Self-pay | Admitting: Family Medicine

## 2020-03-31 LAB — SARS-COV-2, NAA 2 DAY TAT

## 2020-03-31 LAB — NOVEL CORONAVIRUS, NAA: SARS-CoV-2, NAA: DETECTED — AB

## 2020-04-14 ENCOUNTER — Encounter: Payer: Self-pay | Admitting: Family Medicine

## 2020-05-03 ENCOUNTER — Encounter: Payer: Self-pay | Admitting: Family Medicine

## 2020-05-03 DIAGNOSIS — F32A Depression, unspecified: Secondary | ICD-10-CM

## 2020-05-03 NOTE — Telephone Encounter (Signed)
This pt has not had a TOC appt with Dr. Reece Agar, just an acute visit and is now asking for a refill for anxiety med.  Not sure why this was forwarded to Dr. Reece Agar since pt needs a TOC appt first.  And isn't there a hold on Megan Eaton's TOC pts for our office?  Plz advise.

## 2020-05-06 MED ORDER — HYDROXYZINE HCL 25 MG PO TABS: 25.0000 mg | ORAL_TABLET | Freq: Three times a day (TID) | ORAL | 1 refills | Status: AC | PRN

## 2020-05-06 NOTE — Telephone Encounter (Signed)
Hydroxyzine Last rx:  11/09/19, #30/1 Last OV:  01/08/20, anxiety/depression f/u   [No show- 02/17/20, 03/09/20]  Pt has not been scheduled for TOC.

## 2020-05-06 NOTE — Addendum Note (Signed)
Addended by: Nanci Pina on: 05/06/2020 03:39 PM   Modules accepted: Orders

## 2020-08-02 ENCOUNTER — Encounter: Payer: Self-pay | Admitting: Family Medicine

## 2020-08-02 ENCOUNTER — Other Ambulatory Visit: Payer: Self-pay

## 2020-08-02 ENCOUNTER — Ambulatory Visit: Payer: 59 | Admitting: Family Medicine

## 2020-08-02 VITALS — BP 100/70 | HR 87 | Temp 98.2°F | Ht 65.0 in | Wt 128.0 lb

## 2020-08-02 DIAGNOSIS — F411 Generalized anxiety disorder: Secondary | ICD-10-CM | POA: Diagnosis not present

## 2020-08-02 DIAGNOSIS — F320 Major depressive disorder, single episode, mild: Secondary | ICD-10-CM

## 2020-08-02 NOTE — Patient Instructions (Signed)
We will call with referral.  Can use atarax as needed.

## 2020-08-02 NOTE — Progress Notes (Signed)
Patient ID: Megan Eaton, female    DOB: 15-Mar-2001, 20 y.o.   MRN: [id number]  This visit was conducted in person.  BP 100/70   Pulse 87   Temp 98.2 F (36.8 C) (Temporal)   Ht 5\' 5"  (1.651 m)   Wt 128 lb (58.1 kg)   SpO2 99%   BMI 21.30 kg/m    CC:  Chief Complaint  Patient presents with  . Anxiety    Wants Referral to Psychiatrist     Subjective:   HPI: Megan Eaton is a 20 y.o. female presenting on 08/02/2020 for Anxiety (Wants Referral to Psychiatrist/)  GAD, MDD: ongoing for years  PCP in past has treated her with sertraline 100 mg daily and atarax prn... Has been off these for several months. She feels that it did not help   She reports that in last  4-5 months... worsening.  She  has the most issue with her anxiety.  She worried excessively, cannot socialize, isolates self.  The sadness comes from the difficulty dealing with the anxiety.  She has panic attacks when going out,etc.  Poor sleep.  Poor home support.. mother adds to her anxiety issues.  Denies SI.  PHQ9 22 GAD7 21     She has been seeing a therapist for 1 year. 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 She is eating only once a day.  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  1 times or so a month she has vomiting from her anxiety that makes her unable to go to work.  I have agreed to  complete FMLA if needed.  Relevant past medical, surgical, family and social history reviewed and updated as indicated. Interim medical history since our last visit reviewed. Allergies and medications reviewed and updated. Outpatient Medications Prior to Visit  Medication Sig Dispense Refill  . fluticasone (FLONASE) 50 MCG/ACT nasal spray Place 2 sprays into both nostrils daily. 16 g 6  . hydrOXYzine (ATARAX/VISTARIL) 25 MG tablet Take 1-2 tablets (25-50 mg total) by mouth every 8 (eight) hours as needed  for anxiety. 30 tablet 1  . sertraline (ZOLOFT) 100 MG tablet Take 1 tablet (100 mg total) by mouth daily. (Patient not taking: Reported on 08/02/2020) 90 tablet 1   No facility-administered medications prior to visit.     Per HPI unless specifically indicated in ROS section below Review of Systems  Constitutional: Negative for fatigue and fever.  HENT: Negative for congestion.   Eyes: Negative for pain.  Respiratory: Negative for cough and shortness of breath.   Cardiovascular: Negative for chest pain, palpitations and leg swelling.  Gastrointestinal: Negative for abdominal pain.  Genitourinary: Negative for dysuria and vaginal bleeding.  Musculoskeletal: Negative for back pain.  Neurological: Negative for syncope, light-headedness and headaches.  Psychiatric/Behavioral: Negative for dysphoric mood.   Objective:  BP 100/70   Pulse 87   Temp 98.2 F (36.8 C) (Temporal)   Ht 5\' 5"  (1.651 m)   Wt 128 lb (58.1 kg)   SpO2 99%   BMI 21.30 kg/m   Wt Readings from Last 3 Encounters:  08/02/20 128 lb (58.1 kg)  03/29/20 125 lb (56.7 kg)  01/08/20 134 lb (60.8 kg)      Physical Exam Constitutional:      General: She is not in acute distress.    Appearance: Normal appearance. She is well-developed. She is not  ill-appearing or toxic-appearing.  HENT:     Head: Normocephalic.     Right Ear: Hearing, tympanic membrane, ear canal and external ear normal. Tympanic membrane is not erythematous, retracted or bulging.     Left Ear: Hearing, tympanic membrane, ear canal and external ear normal. Tympanic membrane is not erythematous, retracted or bulging.     Nose: No mucosal edema or rhinorrhea.     Right Sinus: No maxillary sinus tenderness or frontal sinus tenderness.     Left Sinus: No maxillary sinus tenderness or frontal sinus tenderness.     Mouth/Throat:     Pharynx: Uvula midline.  Eyes:     General: Lids are normal. Lids are everted, no foreign bodies appreciated.      Conjunctiva/sclera: Conjunctivae normal.     Pupils: Pupils are equal, round, and reactive to light.  Neck:     Thyroid: No thyroid mass or thyromegaly.     Vascular: No carotid bruit.     Trachea: Trachea normal.  Cardiovascular:     Rate and Rhythm: Normal rate and regular rhythm.     Pulses: Normal pulses.     Heart sounds: Normal heart sounds, S1 normal and S2 normal. No murmur heard. No friction rub. No gallop.   Pulmonary:     Effort: Pulmonary effort is normal. No tachypnea or respiratory distress.     Breath sounds: Normal breath sounds. No decreased breath sounds, wheezing, rhonchi or rales.  Abdominal:     General: Bowel sounds are normal.     Palpations: Abdomen is soft.     Tenderness: There is no abdominal tenderness.  Musculoskeletal:     Cervical back: Normal range of motion and neck supple.  Skin:    General: Skin is warm and dry.     Findings: No rash.  Neurological:     Mental Status: She is alert.  Psychiatric:        Mood and Affect: Mood is anxious. Mood is not depressed. Affect is tearful.        Speech: Speech normal.        Behavior: Behavior is agitated. Behavior is cooperative.        Thought Content: Thought content normal.        Cognition and Memory: Cognition and memory normal.        Judgment: Judgment normal.       Results for orders placed or performed in visit on 03/29/20  Novel Coronavirus, NAA ([facility])   Specimen: Nasopharyngeal(NP) swabs in vial transport medium   Nasopharynge  Result Value Ref Range   SARS-CoV-2, NAA Detected (A) Not Detected  SARS-COV-2, NAA 2 DAY TAT   Nasopharynge  Result Value Ref Range   SARS-CoV-2, NAA 2 DAY TAT Performed     This visit occurred during the SARS-CoV-2 public health emergency.  Safety protocols were in place, including screening questions prior to the visit, additional usage of staff PPE, and extensive cleaning of exam room while observing appropriate contact time as indicated for disinfecting  solutions.   COVID 19 screen:  No recent travel or known exposure to COVID19 The patient denies respiratory symptoms of COVID 19 at this time. The importance of social distancing was discussed today.   Assessment and Plan Problem List Items Addressed This Visit    GAD (generalized anxiety disorder) - Primary   MDD (major depressive disorder), single episode, mild (HCC)     She will continue seeing psychologist and I have referred her to psychiatry given  possible bipolar DO as well as eating disorder.  She can use atarax as needed for panic attacks.       Kerby Nora, MD

## 2021-01-17 NOTE — Telephone Encounter (Signed)
Nexplanon not rcvd. Patient interested in Non Hormonal IUD and was to call to schedule appointment for placement after being off hormones for a few weeks.

## 2021-02-14 ENCOUNTER — Encounter: Payer: Self-pay | Admitting: Family Medicine

## 2021-02-14 ENCOUNTER — Ambulatory Visit: Payer: 59 | Admitting: Family Medicine

## 2021-02-14 ENCOUNTER — Other Ambulatory Visit (HOSPITAL_COMMUNITY)
Admission: RE | Admit: 2021-02-14 | Discharge: 2021-02-14 | Disposition: A | Discharge status: Home / Self Care | Payer: 59 | Source: Ambulatory Visit | Attending: Family Medicine | Admitting: Family Medicine

## 2021-02-14 ENCOUNTER — Other Ambulatory Visit: Payer: Self-pay

## 2021-02-14 VITALS — BP 90/62 | HR 88 | Temp 98.5°F | Ht 65.0 in | Wt 145.1 lb

## 2021-02-14 DIAGNOSIS — A749 Chlamydial infection, unspecified: Secondary | ICD-10-CM | POA: Diagnosis not present

## 2021-02-14 DIAGNOSIS — Z23 Encounter for immunization: Secondary | ICD-10-CM

## 2021-02-14 DIAGNOSIS — Z124 Encounter for screening for malignant neoplasm of cervix: Secondary | ICD-10-CM | POA: Insufficient documentation

## 2021-02-14 DIAGNOSIS — Z0000 Encounter for general adult medical examination without abnormal findings: Secondary | ICD-10-CM

## 2021-02-14 DIAGNOSIS — F320 Major depressive disorder, single episode, mild: Secondary | ICD-10-CM | POA: Diagnosis not present

## 2021-02-14 DIAGNOSIS — Z30011 Encounter for initial prescription of contraceptive pills: Secondary | ICD-10-CM

## 2021-02-14 LAB — POCT URINE PREGNANCY: Preg Test, Ur: NEGATIVE

## 2021-02-14 MED ORDER — DROSPIRENONE-ETHINYL ESTRADIOL 3-0.03 MG PO TABS: 1.0000 | ORAL_TABLET | Freq: Every day | ORAL | 11 refills | Status: DC

## 2021-02-14 MED ORDER — MIRTAZAPINE 15 MG PO TABS: 15.0000 mg | ORAL_TABLET | Freq: Every day | ORAL | 1 refills | Status: DC

## 2021-02-14 NOTE — Patient Instructions (Addendum)
Look into setting up psychiatrist.. call if you need any help with setting this up.  Continue Remeron 15 mg daily at bedtime.  Check into insurance coverage of HPV vaccine.. make nurse visit appointment interested.  Please stop at the lab to have labs drawn. Quit vaping/smoking!

## 2021-02-14 NOTE — Addendum Note (Signed)
Addended by: Damita Lack on: 02/14/2021 11:13 AM   Modules accepted: Orders

## 2021-02-14 NOTE — Assessment & Plan Note (Signed)
Improved control with Remeron.. refilled. She will set up a new psychiatrist and continue counseling.

## 2021-02-14 NOTE — Assessment & Plan Note (Signed)
Never had test of cure.. will re-eval today.

## 2021-02-14 NOTE — Assessment & Plan Note (Signed)
Check Upreg and STD testing.  Reviewed options.. will start low androgen OCP... yasmin

## 2021-02-14 NOTE — Progress Notes (Signed)
Patient ID: Megan Eaton, female    DOB: [birth year], 20 y.o.   MRN: [id number]  This visit was conducted in person.  BP 90/62   Pulse 88   Temp 98.5 F (36.9 C) (Temporal)   Ht 5\' 5"  (1.651 m)   Wt 145 lb 2 oz (65.8 kg)   LMP 02/08/2021   SpO2 98%   BMI 24.15 kg/m    CC:  Chief Complaint  Patient presents with   Annual Exam   Medication Management    Discuss Remeron    Subjective:   HPI: Megan Eaton is a 20 y.o. female presenting on 02/14/2021 for Annual Exam and Medication Management (Discuss Remeron)  GAD, MDD:  Last saw Pt in 5.2022 for mood issue.. referred to psychiatrist for consideration of bipolar do as well as eating disorder.  She is not sure what Dx she had but remeron has helped dramatically.  It has helped her not throw u p with anxiety.   No SE to remeron. Wt Readings from Last 3 Encounters:  02/14/21 145 lb 2 oz (65.8 kg)  08/02/20 128 lb (58.1 kg)  03/29/20 125 lb (56.7 kg)   PHQ9: 11  She is seeing therapist weekly... this helps a lot.   She is interested in trying birth control .  She is currently sexually active. She  has irregular menses, has cramping.  Has used nexplanon in the past.     Relevant past medical, surgical, family and social history reviewed and updated as indicated. Interim medical history since our last visit reviewed. Allergies and medications reviewed and updated. Outpatient Medications Prior to Visit  Medication Sig Dispense Refill   fluticasone (FLONASE) 50 MCG/ACT nasal spray Place 2 sprays into both nostrils daily. 16 g 6   hydrOXYzine (ATARAX/VISTARIL) 25 MG tablet Take 1-2 tablets (25-50 mg total) by mouth every 8 (eight) hours as needed for anxiety. 30 tablet 1   mirtazapine (REMERON) 15 MG tablet Take 15 mg by mouth at bedtime.     sertraline (ZOLOFT) 100 MG tablet Take 1 tablet (100 mg total) by mouth daily. (Patient not taking: Reported on 08/02/2020) 90 tablet 1   No facility-administered medications  prior to visit.     Per HPI unless specifically indicated in ROS section below Review of Systems  Constitutional:  Negative for fatigue and fever.  HENT:  Negative for congestion.   Eyes:  Negative for pain.  Respiratory:  Negative for cough and shortness of breath.   Cardiovascular:  Negative for chest pain, palpitations and leg swelling.  Gastrointestinal:  Negative for abdominal pain.  Genitourinary:  Negative for dysuria and vaginal bleeding.  Musculoskeletal:  Negative for back pain.  Neurological:  Negative for syncope, light-headedness and headaches.  Psychiatric/Behavioral:  Negative for dysphoric mood.   Objective:  BP 90/62   Pulse 88   Temp 98.5 F (36.9 C) (Temporal)   Ht 5\' 5"  (1.651 m)   Wt 145 lb 2 oz (65.8 kg)   LMP 02/08/2021   SpO2 98%   BMI 24.15 kg/m   Wt Readings from Last 3 Encounters:  02/14/21 145 lb 2 oz (65.8 kg)  08/02/20 128 lb (58.1 kg)  03/29/20 125 lb (56.7 kg)      Physical Exam Vitals and nursing note reviewed.  Constitutional:      General: She is not in acute distress.    Appearance: Normal appearance. She is well-developed. She is not ill-appearing or toxic-appearing.  HENT:     Head:  Normocephalic.     Right Ear: Hearing, tympanic membrane, ear canal and external ear normal.     Left Ear: Hearing, tympanic membrane, ear canal and external ear normal.     Nose: Nose normal.  Eyes:     General: Lids are normal. Lids are everted, no foreign bodies appreciated.     Conjunctiva/sclera: Conjunctivae normal.     Pupils: Pupils are equal, round, and reactive to light.  Neck:     Thyroid: No thyroid mass or thyromegaly.     Vascular: No carotid bruit.     Trachea: Trachea normal.  Cardiovascular:     Rate and Rhythm: Normal rate and regular rhythm.     Heart sounds: Normal heart sounds, S1 normal and S2 normal. No murmur heard.   No gallop.  Pulmonary:     Effort: Pulmonary effort is normal. No respiratory distress.     Breath  sounds: Normal breath sounds. No wheezing, rhonchi or rales.  Abdominal:     General: Bowel sounds are normal. There is no distension or abdominal bruit.     Palpations: Abdomen is soft. There is no fluid wave or mass.     Tenderness: There is no abdominal tenderness. There is no guarding or rebound.     Hernia: No hernia is present.  Musculoskeletal:     Cervical back: Normal range of motion and neck supple.  Lymphadenopathy:     Cervical: No cervical adenopathy.  Skin:    General: Skin is warm and dry.     Findings: No rash.  Neurological:     Mental Status: She is alert.     Cranial Nerves: No cranial nerve deficit.     Sensory: No sensory deficit.  Psychiatric:        Mood and Affect: Mood is not anxious or depressed.        Speech: Speech normal.        Behavior: Behavior normal. Behavior is cooperative.        Judgment: Judgment normal.      Results for orders placed or performed in visit on 03/29/20  Novel Coronavirus, NAA ([facility])   Specimen: Nasopharyngeal(NP) swabs in vial transport medium   Nasopharynge  Result Value Ref Range   SARS-CoV-2, NAA Detected (A) Not Detected  SARS-COV-2, NAA 2 DAY TAT   Nasopharynge  Result Value Ref Range   SARS-CoV-2, NAA 2 DAY TAT Performed     This visit occurred during the SARS-CoV-2 public health emergency.  Safety protocols were in place, including screening questions prior to the visit, additional usage of staff PPE, and extensive cleaning of exam room while observing appropriate contact time as indicated for disinfecting solutions.   COVID 19 screen:  No recent travel or known exposure to COVID19 The patient denies respiratory symptoms of COVID 19 at this time. The importance of social distancing was discussed today.   Assessment and Plan The patient's preventative maintenance and recommended screening tests for an annual wellness exam were reviewed in full today. Brought up to date unless services declined.  Counselled  on the importance of diet, exercise, and its role in overall health and mortality. The patient's FH and SH was reviewed, including their home life, tobacco status, and drug and alcohol status.   The patient's preventative maintenance and recommended screening tests for an annual wellness exam were reviewed in full today. Brought up to date unless services declined.  Vaccines: given flu shot today  primary series COVID, Td uptodate. Consider HPV  Pap/DVE:  due  Never had test of cure chlamydia from 09/2019 Mammo:  no early family history of breast/ colon Colon:  no early family history   Smoking Status: she vapes daily. ETOH/ drug use:  1-2 times a week, 1-2 beers/ occ weed  encouraged cessation of vaping and marijuana use.   Problem List Items Addressed This Visit     Chlamydia    Never had test of cure.. will re-eval today.      Encounter for initial prescription of contraceptive pills     Check Upreg and STD testing.  Reviewed options.. will start low androgen OCP... yasmin      Relevant Orders   HIV Antibody (routine testing w rflx)   RPR   MDD (major depressive disorder), single episode, mild (HCC)    Improved control with Remeron.. refilled. She will set up a new psychiatrist and continue counseling.      Relevant Medications   mirtazapine (REMERON) 15 MG tablet   Other Visit Diagnoses     Routine general medical examination at a health care facility    -  Primary   Need for influenza vaccination       Relevant Orders   Flu Vaccine QUAD 6+ mos PF IM (Fluarix Quad PF) (Completed)        Kerby Nora, MD

## 2021-02-15 LAB — RPR: RPR Ser Ql: NONREACTIVE

## 2021-02-15 LAB — HIV ANTIBODY (ROUTINE TESTING W REFLEX): HIV 1&2 Ab, 4th Generation: NONREACTIVE

## 2021-02-20 LAB — CYTOLOGY - PAP
Chlamydia: NEGATIVE
Comment: NEGATIVE
Comment: NORMAL
Diagnosis: NEGATIVE
Diagnosis: REACTIVE
Neisseria Gonorrhea: NEGATIVE

## 2021-08-04 IMAGING — DX DG WRIST COMPLETE 3+V*R*
4 series · 4 of 4 positions shown · non-contrast
Comparison: None.

CLINICAL DATA: Pain

EXAM:
RIGHT WRIST - COMPLETE 3+ VIEW

[wrist ap]
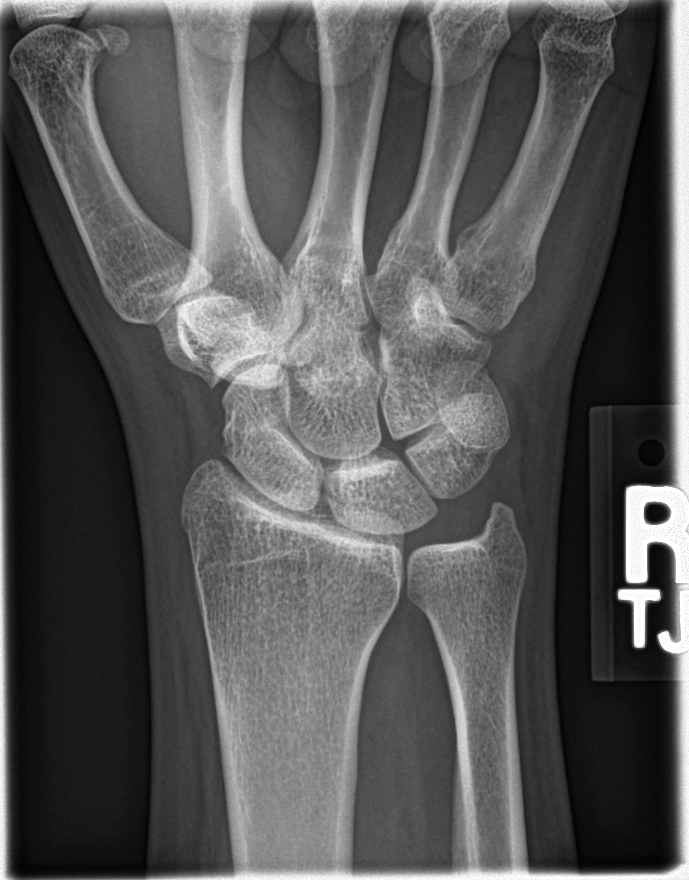

[wrist obl]
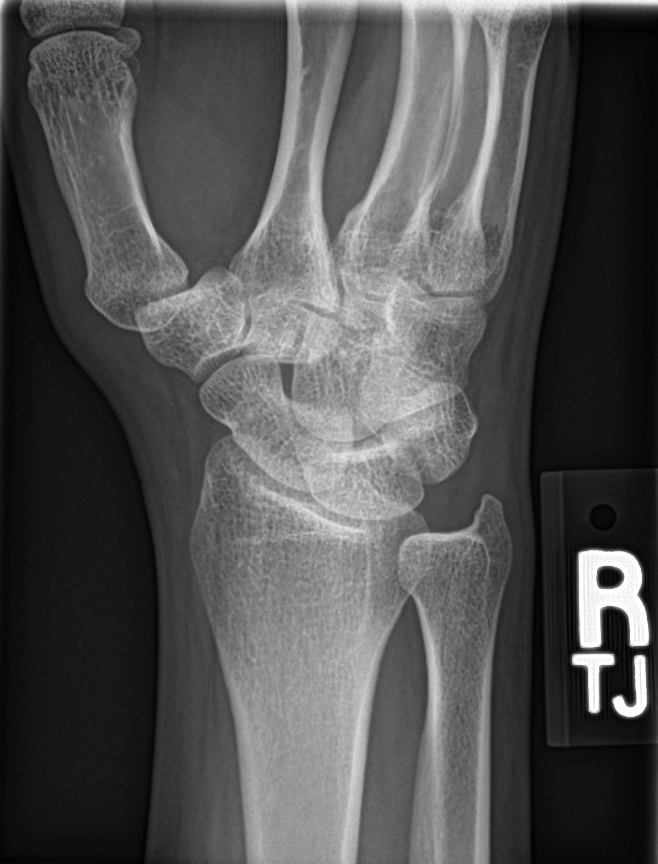

[wrist lat]
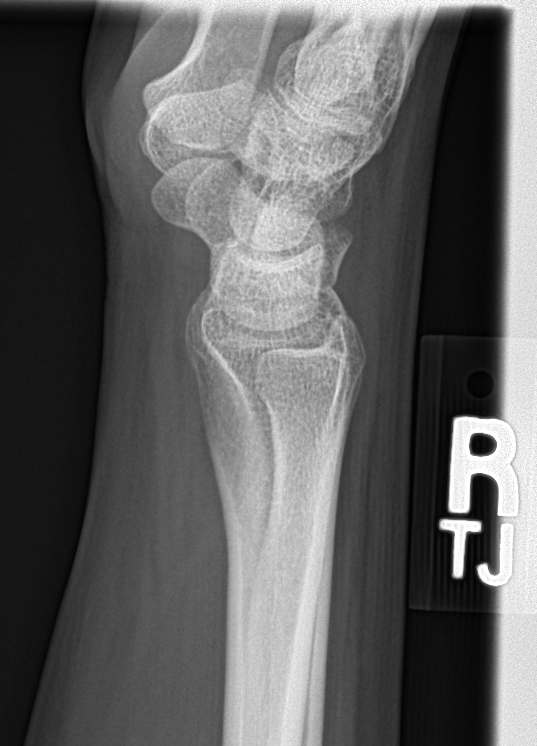

[wrist pa]
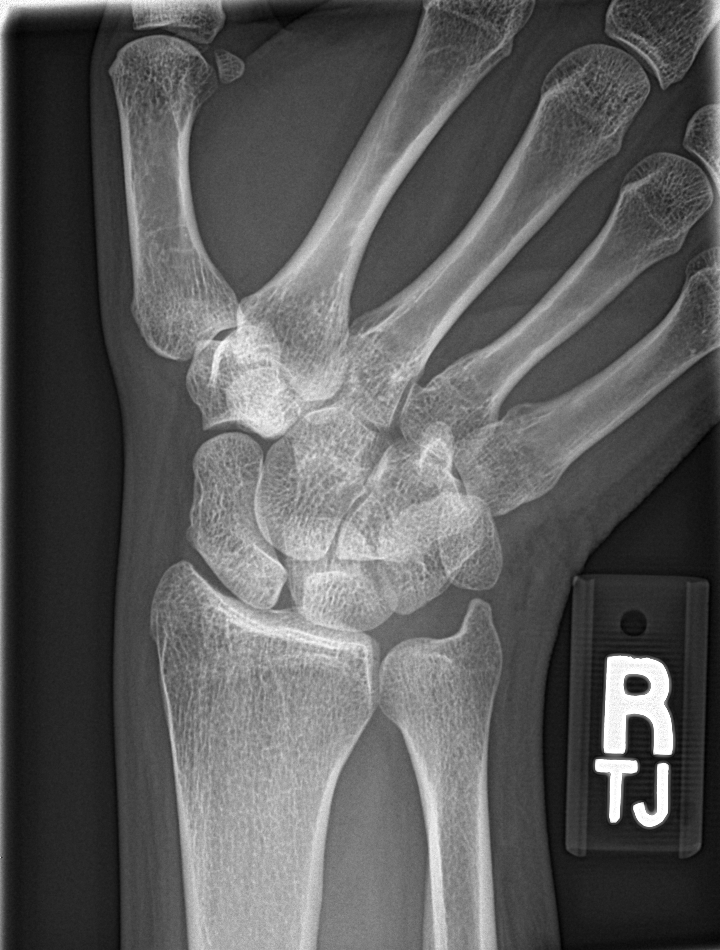

[4 of 4 positions shown; findings below may reference images not displayed]

FINDINGS: Frontal, oblique, lateral, and ulnar deviation scaphoid images were
obtained. There is no evident fracture or dislocation. Joint spaces
appear normal. No erosive change or intra-articular calcification.
IMPRESSION: No fracture or dislocation.  No evident arthropathy.

## 2021-08-20 ENCOUNTER — Other Ambulatory Visit: Payer: Self-pay | Admitting: Family Medicine

## 2021-08-21 NOTE — Telephone Encounter (Signed)
Last office visit 02/14/21 for CPE with Dr. Ermalene Searing.  Last refilled 02/14/21 for #90 with 1 refill.  Megan Eaton is still listed as PCP.  Refill??

## 2021-08-31 ENCOUNTER — Ambulatory Visit: Payer: 59 | Admitting: Family Medicine

## 2022-02-07 ENCOUNTER — Ambulatory Visit: Payer: 59 | Admitting: Primary Care

## 2022-02-07 ENCOUNTER — Encounter: Payer: Self-pay | Admitting: Primary Care

## 2022-02-07 VITALS — BP 118/74 | HR 95 | Temp 99.1°F | Ht 65.0 in | Wt 168.0 lb

## 2022-02-07 DIAGNOSIS — L0291 Cutaneous abscess, unspecified: Secondary | ICD-10-CM

## 2022-02-07 MED ORDER — SULFAMETHOXAZOLE-TRIMETHOPRIM 800-160 MG PO TABS: 1.0000 | ORAL_TABLET | Freq: Two times a day (BID) | ORAL | 0 refills | Status: AC

## 2022-02-07 NOTE — Patient Instructions (Signed)
Start Bactrim DS (sulfamethoxazole/trimethoprim) tablets for infection. Take 1 tablet twice daily for 7 days.  Apply warm compresses to help facilitate drainage.  Please notify us if no resolve/improvement.  It was a pleasure meeting you!

## 2022-02-07 NOTE — Assessment & Plan Note (Addendum)
Obvious infection to left labia majora/groin.  Treat with Bactrim DS tablets, 1 tablet BID x 7 days. Cover for MRSA given purulence.  Discussed use of warm compresses, dressings. Avoid shaving.  Return precautions provided.

## 2022-02-07 NOTE — Progress Notes (Signed)
Subjective:    Patient ID: Megan Eaton, female    DOB: [birth year], 21 y.o.   MRN: [id number]  HPI  Megan Eaton is a very pleasant 21 y.o. female patient of Deboraha Sprang with a history of allergic rhinitis, GAD, MDD who presents today to discuss skin mass.   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 The cyst will wax and wane intermittently. Her last bout of the mass was in May 2023 which resolved quickly. Yesterday she noticed the mass develop redness, increased swelling, increased pain. She applied a warm compress and the mass began expelling a foul smelling puss.   She denies fevers. She's never had the mass look like this before. She's not been shaving.    Review of Systems  Constitutional:  Negative for fever.  Skin:  Positive for color change and wound.         Past Medical History:  Diagnosis Date   Anxiety    COVID-19 virus infection 03/2020   Depression     Social History   Socioeconomic History   Marital status: Single    Spouse name: Not on file   Number of children: Not on file   Years of education: Not on file   Highest education level: Not on file  Occupational History   Not on file  Tobacco Use   Smoking status: Every Day    Types: E-cigarettes   Smokeless tobacco: Never  Vaping Use   Vaping Use: Every day   Start date: 01/31/2014   Devices: 50 mg  Substance and Sexual Activity   Alcohol use: Yes    Comment: occ   Drug use: Yes    Types: Marijuana   Sexual activity: Yes    Partners: Male    Birth control/protection: Implant  Other Topics Concern   Not on file  Social History Narrative   Not on file   Social Determinants of Health   Financial Resource Strain: Not on file  Food Insecurity: Not on file  Transportation Needs: Not on file  Physical Activity: Not on file  Stress: Not on file  Social Connections: Not on file  Intimate Partner Violence: Not on file    Past Surgical History:  Procedure  Laterality Date   EYE SURGERY Right 2005    Family History  Problem Relation Age of Onset   Arthritis Mother    Multiple sclerosis Father    Hypertension Father    Breast cancer Maternal Aunt        30-40s   Heart disease Maternal Grandfather    Heart attack Maternal Grandfather        3xs    No Known Allergies  Current Outpatient Medications on File Prior to Visit  Medication Sig Dispense Refill   hydrOXYzine (ATARAX/VISTARIL) 25 MG tablet Take 1-2 tablets (25-50 mg total) by mouth every 8 (eight) hours as needed for anxiety. 30 tablet 1   mirtazapine (REMERON) 15 MG tablet TAKE 1 TABLET BY MOUTH EVERYDAY AT BEDTIME 90 tablet 1   drospirenone-ethinyl estradiol (YASMIN) 3-0.03 MG tablet Take 1 tablet by mouth daily. (Patient not taking: Reported on 02/07/2022) 28 tablet 11   fluticasone (FLONASE) 50 MCG/ACT nasal spray Place 2 sprays into both nostrils daily. (Patient not taking: Reported on 02/07/2022) 16 g 6   No current facility-administered medications on file prior to visit.    BP 118/74   Pulse 95   Temp 99.1 F (37.3 C) (Temporal)   Ht  5\' 5"  (1.651 m)   Wt 168 lb (76.2 kg)   SpO2 98%   BMI 27.96 kg/m  Objective:   Physical Exam Constitutional:      General: She is not in acute distress. Cardiovascular:     Rate and Rhythm: Normal rate.  Skin:    Findings: Erythema present.     Comments: Abscess to left labia majora/edge of left groin. Deep, measuring 2 cm in length and 1 cm in width. Central opening with purulent drainage. Erythema. Tender.            Assessment & Plan:   Problem List Items Addressed This Visit       Other   Abscess - Primary    Obvious infection to left labia majora/groin.  Treat with Bactrim DS tablets, 1 tablet BID x 7 days. Cover for MRSA given purulence.  Discussed use of warm compresses, dressings. Avoid shaving.  Return precautions provided.       Relevant Medications   sulfamethoxazole-trimethoprim (BACTRIM DS)  800-160 MG tablet       , NP

## 2022-02-08 ENCOUNTER — Ambulatory Visit: Payer: 59 | Admitting: Family Medicine

## 2022-02-27 ENCOUNTER — Other Ambulatory Visit: Payer: Self-pay | Admitting: Family Medicine

## 2022-02-28 NOTE — Telephone Encounter (Signed)
Please call and schedule TOC appointment for patient.  Then send back to me please to refill medication.

## 2022-02-28 NOTE — Telephone Encounter (Signed)
Lvmctb, sent mychart message

## 2022-04-03 ENCOUNTER — Other Ambulatory Visit: Payer: Self-pay | Admitting: Family Medicine

## 2022-04-23 ENCOUNTER — Other Ambulatory Visit: Payer: Self-pay | Admitting: Family Medicine

## 2022-04-23 NOTE — Telephone Encounter (Signed)
Lvmtcb, sent mychart message  

## 2022-04-23 NOTE — Telephone Encounter (Signed)
Last office visit 02/07/22 with [name] for Abscess.  Last CPE 02/14/21 with Dr. Diona Browner.  Patient still listed as Megan Eaton patient.  Office has tried to reach patient multiple times to schedule her a TOC appointment.  No future appointments. Refill?

## 2022-04-23 NOTE — Telephone Encounter (Signed)
Please call and schedule TOC appointment with [place] or [place].

## 2022-05-19 ENCOUNTER — Other Ambulatory Visit: Payer: Self-pay | Admitting: Family Medicine

## 2022-05-29 ENCOUNTER — Other Ambulatory Visit: Payer: Self-pay | Admitting: Family Medicine

## 2022-06-26 ENCOUNTER — Other Ambulatory Visit: Payer: Self-pay | Admitting: Family Medicine

## 2022-06-26 NOTE — Telephone Encounter (Signed)
Last office visit 02/07/22 with [name] for Abscess.  Last CPE 02/14/21 with Dr. Diona Browner.  Last refilled 08/22/2021 for#90 with 1 refill. Patient still listed as Megan Eaton patient.  Office has reached out to patient to schedule her a TOC appointment.  No future appointments. Refill?
